# Patient Record
Sex: Female | Born: 1990 | Race: White | Hispanic: No | Marital: Single | Smoking: Current every day smoker
Health system: Southern US, Community
[De-identification: ages and names within clinical notes are randomized; demographics above are authoritative.]

## PROBLEM LIST (undated history)

## (undated) DIAGNOSIS — F419 Anxiety disorder, unspecified: Secondary | ICD-10-CM

## (undated) DIAGNOSIS — J45909 Unspecified asthma, uncomplicated: Secondary | ICD-10-CM

## (undated) DIAGNOSIS — I7300 Raynaud's syndrome without gangrene: Secondary | ICD-10-CM

## (undated) HISTORY — PX: WISDOM TOOTH EXTRACTION: SHX21

---

## 2017-12-03 ENCOUNTER — Telehealth: Payer: Self-pay | Admitting: *Deleted

## 2017-12-04 ENCOUNTER — Other Ambulatory Visit: Payer: Self-pay | Admitting: *Deleted

## 2017-12-04 DIAGNOSIS — O3680X0 Pregnancy with inconclusive fetal viability, not applicable or unspecified: Secondary | ICD-10-CM

## 2017-12-04 NOTE — Progress Notes (Signed)
Pt scheduled @ [facility] for dating [facility]/S on 9/13 @ 1:30.

## 2017-12-21 ENCOUNTER — Ambulatory Visit (HOSPITAL_COMMUNITY): Admission: RE | Admit: 2017-12-21 | Payer: Medicaid Other | Source: Ambulatory Visit

## 2018-06-10 NOTE — Telephone Encounter (Signed)
Note sent to nurse. 

## 2018-11-05 ENCOUNTER — Encounter: Payer: Self-pay | Admitting: Physical Therapy

## 2018-11-05 ENCOUNTER — Ambulatory Visit: Payer: Medicaid Other | Attending: Physical Medicine and Rehabilitation | Admitting: Physical Therapy

## 2018-11-05 ENCOUNTER — Other Ambulatory Visit: Payer: Self-pay

## 2018-11-05 DIAGNOSIS — M545 Low back pain, unspecified: Secondary | ICD-10-CM

## 2018-11-05 DIAGNOSIS — M62830 Muscle spasm of back: Secondary | ICD-10-CM | POA: Diagnosis present

## 2018-11-05 DIAGNOSIS — R293 Abnormal posture: Secondary | ICD-10-CM | POA: Insufficient documentation

## 2018-11-05 NOTE — Therapy (Signed)
[facility] [address] [place], [place], [address] Phone: [phone]   Fax:  [phone]  Physical Therapy Evaluation  Patient Details  Name: Ashley Huffman MRN: [id number] Date of Birth: 08/26/1990 Referring Provider (PT): Almyra Free, MD    Encounter Date: 11/05/2018  PT End of Session - 11/05/18 1435    Visit Number  1    Date for PT Re-Evaluation  01/03/19    Authorization Type  Medicaid    Authorization Time Period  11/05/18 to 01/03/19, pending medicaid approval    PT Start Time  1408    PT Stop Time  1438    PT Time Calculation (min)  30 min    Activity Tolerance  Patient tolerated treatment well;No increased pain    Behavior During Therapy  [facility] for tasks assessed/performed       History reviewed. No pertinent past medical history.  History reviewed. No pertinent surgical history.  There were no vitals filed for this visit.   Subjective Assessment - 11/05/18 1410    Subjective  Pt states that she started having low back pain approximately one month after having her son 3 months ago. She has trouble caring for her son, standing and lifting. She has been going and doing swimming therapy on her own 2x/week which she thinks it helping.    Pertinent History  epidural, vaginal delivery without complications    Limitations  Lifting;Standing;House hold activities    Diagnostic tests  Xray: shows scoliosis and inflammation    Patient Stated Goals  decrease pain    Currently in Pain?  Yes    Pain Score  7     Pain Location  Back    Pain Orientation  Lower;Right;Left    Pain Descriptors / Indicators  Aching;Tightness    Pain Type  Acute pain    Pain Radiating Towards  sometimes shoots up her back    Pain Onset  More than a month ago    Pain Frequency  Intermittent    Aggravating Factors   any type activity    Pain Relieving Factors  laying on back, swimming    Effect of Pain on Daily Activities  limited  ability to care for her 47 month old during the day         [facility] PT Assessment - 11/05/18 0001      Assessment   Medical Diagnosis  low back pain    Referring Provider (PT)  Almyra Free, MD     Onset Date/Surgical Date  09/03/18    Next MD Visit  2 days     Prior Therapy  1 chiropractor visit which made things worse       Precautions   Precautions  None      Restrictions   Weight Bearing Restrictions  No      Balance Screen   Has the patient fallen in the past 6 months  No    Has the patient had a decrease in activity level because of a fear of falling?   No    Is the patient reluctant to leave their home because of a fear of falling?   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       Sensation   Additional Comments  denied numbness/tingling  Posture/Postural Control   Posture Comments  decreased abdominal activation noted in standing with increased lumbar lordosis      ROM / Strength   AROM / PROM / Strength  AROM;Strength   passive ROM: Lt hip IR/ER 25 deg, Rt hip IR/ER 30 deg      AROM   AROM Assessment Site  Lumbar    Lumbar Flexion  50% limited, decreased lumbar flexion noted    Lumbar Extension  75% limited, pain end range     Lumbar - Right Rotation  25% limited, pain     Lumbar - Left Rotation  25% limited, pain free      Strength   Strength Assessment Site  Hip;Knee    Right/Left Hip  Right;Left    Right Hip Flexion  5/5    Right Hip Extension  4/5    Right Hip External Rotation   4/5    Right Hip Internal Rotation  4/5    Right Hip ABduction  5/5    Left Hip Flexion  5/5    Left Hip Extension  4/5    Left Hip External Rotation  4/5    Left Hip Internal Rotation  4/5    Left Hip ABduction  5/5    Right/Left Knee  --   5/5 MMT      Flexibility   Soft Tissue Assessment /Muscle Length  yes    Quadriceps  --   (+) thomas test B hip  flexors                Objective measurements completed on examination: See above findings.      [facility] Adult PT Treatment/Exercise - 11/05/18 0001      Self-Care   Self-Care  Posture    Posture  proper breathing in supine; lumbar towel roll while seated              PT Education - 11/05/18 1459    Education Details  eval findings/POC    Person(s) Educated  Patient    Methods  Explanation    Comprehension  Verbalized understanding       PT Short Term Goals - 11/05/18 1448      PT SHORT TERM GOAL #1   Title  Pt will demo consistency and independence with her initial HEP to improve pain and mobility.    Time  3    Period  Weeks    Status  New    Target Date  11/26/18      PT SHORT TERM GOAL #2   Title  Pt will report atleast 30% improvement in her low back pain from the start of PT.    Time  3    Period  Weeks    Status  New      PT SHORT TERM GOAL #3   Title  Pt will demo safe floor to stand transition while holding her son, 2/3 trials, without the need for therapist cuing.    Time  3    Period  Weeks    Status  New        PT Long Term Goals - 11/05/18 1450      PT LONG TERM GOAL #1   Title  Pt will have greater than 40 deg Lt and Rt hip internal and external rotation which will help with mechanics during squat and other daily activity.    Time  8    Period  Weeks    Status  New  Target Date  01/03/19      PT LONG TERM GOAL #2   Title  Pt will report atleast 60% improvement in her low back pain throughout the day to improve her quality of life and her ability to care for her son.    Time  8    Period  Weeks    Status  New      PT LONG TERM GOAL #3   Title  Pt will be able to complete active lumbar ROM without increase in low back pain which will help with her quality of movement during the day.    Time  8    Period  Weeks    Status  New      PT LONG TERM GOAL #4   Title  Pt will be able to lift atleast #10 box from the floor 2/3  trials without increase in low back pain or the need for therapist cuing for proper technique.    Time  8    Period  Weeks    Status  New      PT LONG TERM GOAL #5   Title  Pt will report being able to sleep atleast 4 days a week without interruption from back pain.    Time  8    Period  Weeks    Status  New             Plan - 11/05/18 1439    Clinical Impression Statement  Pt is a pleasant 28 y.o F referred to OPPT with complaints of low back pain following the birth of her son. Her pain began approximately 1 month after delivery and has been unchanged with medication since then. She demonstrates trunk weakness evident by standing posture. In addition, she has near full LE strength but greatly limited hip extension and rotation bilaterally. There are also lumbar flexibility limitations along with muscle spasm of the lumbar musculature which causes pain with bending and twisting. This is greatly limiting her ability to care for herself and her 233 month old son throughout the day. She would benefit from skilled PT intervention to educate on safe body mechanics and decrease pain and promote strength/flexibility improvements in order to increase her trunk support and mobility throughout the day.    Personal Factors and Comorbidities  Fitness    Examination-Activity Limitations  Lift;Stand;Caring for Others    Examination-Participation Restrictions  Yard Work;Cleaning;Laundry;Other    Stability/Clinical Decision Making  Stable/Uncomplicated    Clinical Decision Making  Low    Rehab Potential  Good    PT Frequency  2x / week    PT Duration  8 weeks    PT Treatment/Interventions  Moist Heat;ADLs/Self Care Home Management;Cryotherapy;Neuromuscular re-education;Therapeutic exercise;Therapeutic activities;Patient/family education;Manual techniques;Passive range of motion;Dry needling;Taping;Spinal Manipulations    PT Next Visit Plan  f/u on proper breathing; hip flexor/rotator stretch; thoracic  and lumbar mobility; begin supine abdominal strengthening    PT Home Exercise Plan  next visit; working on breathing    Consulted and Agree with Plan of Care  Patient       Patient will benefit from skilled therapeutic intervention in order to improve the following deficits and impairments:  Decreased activity tolerance, Decreased strength, Impaired flexibility, Postural dysfunction, Pain, Improper body mechanics, Decreased range of motion, Hypomobility, Increased muscle spasms  Visit Diagnosis: 1. Acute midline low back pain without sciatica   2. Muscle spasm of back   3. Abnormal posture  Problem List There are no active problems to display for this patient.   3:00 PM,11/05/18 Donita BrooksSara Kynzlie Hilleary PT, DPT [facility]  [phone]  [facility] [address] [place], [place], [id number] Phone: [phone]   Fax:  [phone]  Name: Annette Stableatalie Dashner MRN: [id number] Date of Birth: 03/01/1991

## 2018-11-12 ENCOUNTER — Encounter: Payer: Medicaid Other | Admitting: Physical Therapy

## 2018-11-19 ENCOUNTER — Ambulatory Visit: Payer: Medicaid Other | Attending: Physical Medicine and Rehabilitation | Admitting: Physical Therapy

## 2018-11-19 ENCOUNTER — Telehealth: Payer: Self-pay | Admitting: Physical Therapy

## 2018-11-19 DIAGNOSIS — M62830 Muscle spasm of back: Secondary | ICD-10-CM | POA: Insufficient documentation

## 2018-11-19 DIAGNOSIS — M545 Low back pain: Secondary | ICD-10-CM | POA: Insufficient documentation

## 2018-11-19 DIAGNOSIS — R293 Abnormal posture: Secondary | ICD-10-CM | POA: Insufficient documentation

## 2018-11-19 NOTE — Telephone Encounter (Signed)
No show. Pt states she thought her appointment was at 430 pm. Therapist offered her a 3 pm appointment from cancellation, but she did not feel that she could make that. She confirmed her next appointment.   2:00 PM,11/19/18 [place], [place] at [place]

## 2018-11-26 ENCOUNTER — Ambulatory Visit: Payer: Medicaid Other | Admitting: Physical Therapy

## 2018-11-26 ENCOUNTER — Other Ambulatory Visit: Payer: Self-pay

## 2018-11-26 ENCOUNTER — Encounter: Payer: Self-pay | Admitting: Physical Therapy

## 2018-11-26 DIAGNOSIS — M62830 Muscle spasm of back: Secondary | ICD-10-CM | POA: Diagnosis present

## 2018-11-26 DIAGNOSIS — R293 Abnormal posture: Secondary | ICD-10-CM | POA: Diagnosis present

## 2018-11-26 DIAGNOSIS — M545 Low back pain, unspecified: Secondary | ICD-10-CM

## 2018-11-26 NOTE — Patient Instructions (Signed)
Access Code: [email or web address]  URL: [email or web address]  Date: 11/26/2018  Prepared by: Sherol Dade   Exercises  Supine Piriformis Stretch with Foot on Ground - 2 reps - 20 hold - 2x daily - 7x weekly  Supine Piriformis Stretch - 2 reps - 20 hold - 2x daily - 7x weekly  Supine Bridge - 10 reps - 2 sets - 1x daily - 7x weekly  Sidelying Thoracic and Shoulder Rotation - 10 reps - 2x daily - 7x weekly    [facility] [address], [place], [place] [address] Phone # [phone] Fax [phone]

## 2018-11-26 NOTE — Therapy (Addendum)
[facility] [address] [place], [place], [address] Phone: [phone]   Fax:  [phone]  Physical Therapy Treatment/Discharge  Patient Details  Name: Ashley Huffman MRN: [id number] Date of Birth: 09/07/1990 Referring Provider (PT): Almyra Free, MD    Encounter Date: 11/26/2018  PT End of Session - 11/26/18 1414    Visit Number  2    Date for PT Re-Evaluation  01/03/19    Authorization Type  Medicaid    Authorization Time Period  11/05/18 to 01/03/19, pending medicaid approval    PT Start Time  1335    PT Stop Time  1414    PT Time Calculation (min)  39 min    Activity Tolerance  Patient tolerated treatment well;No increased pain    Behavior During Therapy  [facility] for tasks assessed/performed       History reviewed. No pertinent past medical history.  History reviewed. No pertinent surgical history.  There were no vitals filed for this visit.  Subjective Assessment - 11/26/18 1336    Subjective  Pt states that she is back working 30 hours a week. She is still caring for her son when not at work. She feels her back is worse since she was here last becasue she is busier.    Pertinent History  epidural, vaginal delivery without complications    Limitations  Lifting;Standing;House hold activities    Diagnostic tests  Xray: shows scoliosis and inflammation    Patient Stated Goals  decrease pain    Currently in Pain?  --   no change in pain   Pain Onset  More than a month ago                       [facility] Adult PT Treatment/Exercise - 11/26/18 0001      Self-Care   Self-Care  Posture    Posture  demo floor to stand hands free       Exercises   Exercises  Lumbar      Lumbar Exercises: Stretches   Piriformis Stretch  3 reps;Left;Right;30 seconds    Piriformis Stretch Limitations  supine       Lumbar Exercises: Supine   Bridge  10 reps    Bridge Limitations  breathing coordination; x5 reps  with yellow TB row each side       Lumbar Exercises: Sidelying   Other Sidelying Lumbar Exercises  thoracic rotation strech x10 reps each direction      Manual Therapy   Manual Therapy  Soft tissue mobilization;Joint mobilization    Joint Mobilization  CPAs lumbar spine, Grade III-IV x1 bout     Soft tissue mobilization  STM Rt lumbar paraspinals, Rt QL             PT Education - 11/26/18 1413    Education Details  implemented HEP; hands free floor to stand    Person(s) Educated  Patient    Methods  Explanation;Demonstration;Handout    Comprehension  Verbalized understanding;Returned demonstration       PT Short Term Goals - 11/26/18 1511      PT SHORT TERM GOAL #1   Title  Pt will demo consistency and independence with her initial HEP to improve pain and mobility.    Baseline  implemented    Time  3    Period  Weeks    Status  Partially Met    Target Date  11/26/18      PT  SHORT TERM GOAL #2   Title  Pt will report atleast 30% improvement in her low back pain from the start of PT.    Time  3    Period  Weeks    Status  New      PT SHORT TERM GOAL #3   Title  Pt will demo safe floor to stand transition while holding her son, 2/3 trials, without the need for therapist cuing.    Baseline  1/3 trials during today's session    Time  3    Period  Weeks    Status  Partially Met        PT Long Term Goals - 11/05/18 1450      PT LONG TERM GOAL #1   Title  Pt will have greater than 40 deg Lt and Rt hip internal and external rotation which will help with mechanics during squat and other daily activity.    Time  8    Period  Weeks    Status  New    Target Date  01/03/19      PT LONG TERM GOAL #2   Title  Pt will report atleast 60% improvement in her low back pain throughout the day to improve her quality of life and her ability to care for her son.    Time  8    Period  Weeks    Status  New      PT LONG TERM GOAL #3   Title  Pt will be able to complete active  lumbar ROM without increase in low back pain which will help with her quality of movement during the day.    Time  8    Period  Weeks    Status  New      PT LONG TERM GOAL #4   Title  Pt will be able to lift atleast #10 box from the floor 2/3 trials without increase in low back pain or the need for therapist cuing for proper technique.    Time  8    Period  Weeks    Status  New      PT LONG TERM GOAL #5   Title  Pt will report being able to sleep atleast 4 days a week without interruption from back pain.    Time  8    Period  Weeks    Status  New            Plan - 11/26/18 1416    Clinical Impression Statement  Pt was unable to attend her previous 2 appointments due to scheduling conflicts secondary to [id number] restrictions at the clinic. Today's session focused on implementing her HEP to further increase hip flexibility and spine mobility in the thoracic and lumbar region. She has been able to demonstrate progress towards her short term goals, demonstrating good understanding of HEP updates as well as safe floor to stand transition without UE support 1 out of 3 times. Pt continues to have pain throughout the day and while caring for her son although she is now more active without any increase in pain since the evaluation. She has palpable muscle spasm and tenderness of the lumbar paraspinals which was addressed some this visit with manual techniques. Pt also continues to demonstrate limitations in hip flexibility and strength as expected with the limited number of sessions she has been able to attend at this point. She would continue to benefit from skilled PT to increase hip strength, trunk strength  and LE/trunk flexibility in order to improve her independence and ability to care for her son throughout the day.    Personal Factors and Comorbidities  Fitness    Examination-Activity Limitations  Lift;Stand;Caring for Others    Examination-Participation Restrictions  Yard  Work;Cleaning;Laundry;Other    Stability/Clinical Decision Making  Stable/Uncomplicated    Rehab Potential  Good    PT Frequency  2x / week    PT Duration  8 weeks    PT Treatment/Interventions  Moist Heat;ADLs/Self Care Home Management;Cryotherapy;Neuromuscular re-education;Therapeutic exercise;Therapeutic activities;Patient/family education;Manual techniques;Passive range of motion;Dry needling;Taping;Spinal Manipulations    PT Next Visit Plan  f/u on HEP; thoracic and lumbar mobility; progress abdominal and hip strength    PT Home Exercise Plan  [email or web address]    Consulted and Agree with Plan of Care  Patient       Patient will benefit from skilled therapeutic intervention in order to improve the following deficits and impairments:  Decreased activity tolerance, Decreased strength, Impaired flexibility, Postural dysfunction, Pain, Improper body mechanics, Decreased range of motion, Hypomobility, Increased muscle spasms  Visit Diagnosis: 1. Acute midline low back pain without sciatica   2. Muscle spasm of back   3. Abnormal posture        Problem List There are no active problems to display for this patient.   4:59 PM,11/26/18 Sherol Dade PT, [place] at [place]  [facility] [address] [place], [place], [address] Phone: [phone]   Fax:  [phone]  Name: Tyrina Hines MRN: [id number] Date of Birth: Oct 19, 1990  *addendum to resolve episode of care and d/c pt from PT  [place]  Visits from Start of Care: 2  Current functional level related to goals / functional outcomes: See above for more details    Remaining deficits: See above for more details    Education / Equipment: See above for more details   Plan: Patient agrees to discharge.  Patient goals were not met. Patient is being discharged due to not returning since the  last visit.  ?????     2:26 PM,02/25/19 [place], [place] at [place]

## 2018-12-03 ENCOUNTER — Telehealth: Payer: Self-pay | Admitting: Physical Therapy

## 2018-12-03 ENCOUNTER — Ambulatory Visit: Payer: Medicaid Other | Admitting: Physical Therapy

## 2018-12-03 NOTE — Telephone Encounter (Signed)
No show. Unable to leave voicemail for pt because mailbox is full.   1:51 PM,12/03/18 Sherol Dade PT, [place] at [place]

## 2018-12-10 ENCOUNTER — Telehealth: Payer: Self-pay | Admitting: Physical Therapy

## 2018-12-10 ENCOUNTER — Ambulatory Visit: Payer: Medicaid Other | Attending: Physical Medicine and Rehabilitation | Admitting: Physical Therapy

## 2018-12-10 NOTE — Telephone Encounter (Signed)
No show. PT unable to leave voicemail due to mailbox being full. Due to poor attendance, pt will be discharged.  2:30 PM,12/10/18 [place], [place] at [place]

## 2018-12-17 ENCOUNTER — Ambulatory Visit: Payer: Medicaid Other | Admitting: Physical Therapy

## 2019-01-18 ENCOUNTER — Other Ambulatory Visit: Payer: Self-pay

## 2019-01-18 ENCOUNTER — Emergency Department (HOSPITAL_COMMUNITY)
Admission: EM | Admit: 2019-01-18 | Discharge: 2019-01-19 | Disposition: A | Discharge status: Home / Self Care | Payer: Medicaid Other | Attending: Emergency Medicine | Admitting: Emergency Medicine

## 2019-01-18 ENCOUNTER — Encounter (HOSPITAL_COMMUNITY): Payer: Self-pay | Admitting: *Deleted

## 2019-01-18 DIAGNOSIS — F17210 Nicotine dependence, cigarettes, uncomplicated: Secondary | ICD-10-CM | POA: Insufficient documentation

## 2019-01-18 DIAGNOSIS — B349 Viral infection, unspecified: Secondary | ICD-10-CM | POA: Diagnosis not present

## 2019-01-18 DIAGNOSIS — J45909 Unspecified asthma, uncomplicated: Secondary | ICD-10-CM | POA: Diagnosis not present

## 2019-01-18 DIAGNOSIS — R509 Fever, unspecified: Secondary | ICD-10-CM | POA: Diagnosis present

## 2019-01-18 HISTORY — DX: Raynaud's syndrome without gangrene: I73.00

## 2019-01-18 HISTORY — DX: Unspecified asthma, uncomplicated: J45.909

## 2019-01-18 HISTORY — DX: Anxiety disorder, unspecified: F41.9

## 2019-01-18 NOTE — ED Triage Notes (Signed)
Pt c/o low grade fever, cold blister on bottom, fatigue, chills, nausea, sore throat that started yesterday,

## 2019-01-18 NOTE — ED Provider Notes (Signed)
[facility] EMERGENCY DEPARTMENT Provider Note   CSN: [id number] Arrival date & time: 01/18/19  2053     History   Chief Complaint Chief Complaint  Patient presents with  . Fever    HPI Ashley Huffman is a 28 y.o. female with a history of asthma and anxiety presenting with a 2 day history of fever to 99.9, generalized fatigue and body aches, intermittent fevers and chills nausea without emesis, sore throat and generalized weakness.  She denies shortness of breath or wheezing, no cough, chest pain or abdominal pain.  She has had some intermittent mild nausea without emesis.  She has developed a cold sore on her lower lip which she states occurs when she is ill.  She denies diarrhea, dysuria, rash, neck pain or stiffness.  She has had a generalized headache.  She has taken Tylenol based sinus medication without significant improvement in symptoms.  She has had no known COVID-19 exposures but is concerned about this possible infection.     The history is provided by the patient.    Past Medical History:  Diagnosis Date  . Anxiety   . Asthma   . Raynaud disease     There are no active problems to display for this patient.   Past Surgical History:  Procedure Laterality Date  . WISDOM TOOTH EXTRACTION       OB History   No obstetric history on file.      Home Medications    Prior to Admission medications   Medication Sig Start Date End Date Taking? Authorizing Provider  ferrous sulfate 325 (65 FE) MG tablet Take 325 mg by mouth daily with breakfast.    Yes [provider]    Family History No family history on file.  Social History Social History   Tobacco Use  . Smoking status: Current Every Day Smoker  . Smokeless tobacco: Never Used  Substance Use Topics  . Alcohol use: Never    Frequency: Never  . Drug use: Never     Allergies   Keflex [cephalexin]   Review of Systems Review of Systems  Constitutional: Positive for chills and fever.   HENT: Positive for sore throat. Negative for congestion, ear pain, rhinorrhea, sinus pressure, trouble swallowing and voice change.   Eyes: Negative for discharge.  Respiratory: Positive for cough. Negative for shortness of breath, wheezing and stridor.   Cardiovascular: Negative for chest pain.  Gastrointestinal: Positive for nausea. Negative for abdominal pain and vomiting.  Genitourinary: Negative.  Negative for dysuria.  Musculoskeletal: Positive for myalgias. Negative for neck pain and neck stiffness.  Skin: Negative.   Neurological: Positive for headaches.     Physical Exam Updated Vital Signs BP (!) 132/92   Pulse 60   Temp 98.2 F (36.8 C)   Resp 18   Ht 5\' 5"  (1.651 m)   Wt 59 kg   SpO2 99%   BMI 21.63 kg/m   Physical Exam Vitals signs and nursing note reviewed.  Constitutional:      Appearance: She is well-developed.  HENT:     Head: Normocephalic and atraumatic.     Nose: Mucosal edema present. No congestion or rhinorrhea.     Mouth/Throat:     Mouth: Mucous membranes are moist.     Pharynx: Uvula midline. No oropharyngeal exudate or posterior oropharyngeal erythema.     Tonsils: No tonsillar abscesses.     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  Eyes:     Conjunctiva/sclera: Conjunctivae  normal.  Neck:     Musculoskeletal: Normal range of motion. No neck rigidity.  Cardiovascular:     Rate and Rhythm: Normal rate.     Heart sounds: Normal heart sounds.  Pulmonary:     Effort: Pulmonary effort is normal. No respiratory distress.     Breath sounds: Normal breath sounds. No wheezing, rhonchi or rales.  Abdominal:     General: Abdomen is flat.     Palpations: Abdomen is soft.     Tenderness: There is no abdominal tenderness.  Musculoskeletal: Normal range of motion.  Skin:    General: Skin is warm and dry.     Findings: No rash.  Neurological:     General: No focal deficit present.     Mental Status: She is alert and oriented to  person, place, and time.      ED Treatments / Results  Labs (all labs ordered are listed, but only abnormal results are displayed) Labs Reviewed  NOVEL CORONAVIRUS, NAA (HOSP ORDER, SEND-OUT TO REF LAB; TAT 18-24 HRS)    EKG None  Radiology No results found.  Procedures Procedures (including critical care time)  Medications Ordered in ED Medications - No data to display   Initial Impression / Assessment and Plan / ED Course  I have reviewed the triage vital signs and the nursing notes.  Pertinent labs & imaging results that were available during my care of the patient were reviewed by me and considered in my medical decision making (see chart for details).        Patient with symptoms suggesting a viral syndrome, she has been screened for COVID-19 tonight.  She was encouraged to rest, may try ibuprofen for better relief of myalgias.  Discussed increase fluid intake.  Also outlined strict return precautions.  She was given information about home isolation pending results of her COVID-19 test.  Her exam is reassuring at this time with no shortness of breath, lungs are clear to auscultation, vital signs stable.  Discussed OTC Abreva for cold sore treatment.  [name] was evaluated in Emergency Department on 01/18/2019 for the symptoms described in the history of present illness. She was evaluated in the context of the global COVID-19 pandemic, which necessitated consideration that the patient might be at risk for infection with the SARS-CoV-2 virus that causes COVID-19. Institutional protocols and algorithms that pertain to the evaluation of patients at risk for COVID-19 are in a state of rapid change based on information released by regulatory bodies including the CDC and federal and state organizations. These policies and algorithms were followed during the patient's care in the ED.   Final Clinical Impressions(s) / ED Diagnoses   Final diagnoses:  Viral syndrome     ED Discharge Orders    None       Landis Martins 01/18/19 2346    Rolland Porter, MD 01/18/19 2348

## 2019-01-18 NOTE — Discharge Instructions (Addendum)
Rest,  Drink plenty of fluids.  Take motrin or tylenol for achiness and fever reduction.  Your Covid test is pending and should result within 1-2 days.  It will be important for you to maintain home isolation until your test is resulted (and negative).  If positive you will need to maintain home isolation for 10 days or until your symptoms have resolved.  Get rechecked for increased shortness of breath,  Increased fever or increasing weakness.     Person Under Monitoring Name: Ashley Huffman  Location[place] [address] [place] [id number]   Infection Prevention Recommendations for Individuals Confirmed to have, or Being Evaluated for, 2019 Novel Coronavirus (COVID-19) Infection Who Receive Care at Home  Individuals who are confirmed to have, or are being evaluated for, COVID-19 should follow the prevention steps below until a healthcare provider or local or state health department says they can return to normal activities.  Stay home except to get medical care You should restrict activities outside your home, except for getting medical care. Do not go to work, school, or public areas, and do not use public transportation or taxis.  Call ahead before visiting your doctor Before your medical appointment, call the healthcare provider and tell them that you have, or are being evaluated for, COVID-19 infection. This will help the healthcare providers office take steps to keep other people from getting infected. Ask your healthcare provider to call the local or state health department.  Monitor your symptoms Seek prompt medical attention if your illness is worsening (e.g., difficulty breathing). Before going to your medical appointment, call the healthcare provider and tell them that you have, or are being evaluated for, COVID-19 infection. Ask your healthcare provider to call the local or state health department.  Wear a facemask You should wear a facemask that covers your nose and mouth  when you are in the same room with other people and when you visit a healthcare provider. People who live with or visit you should also wear a facemask while they are in the same room with you.  Separate yourself from other people in your home As much as possible, you should stay in a different room from other people in your home. Also, you should use a separate bathroom, if available.  Avoid sharing household items You should not share dishes, drinking glasses, cups, eating utensils, towels, bedding, or other items with other people in your home. After using these items, you should wash them thoroughly with soap and water.  Cover your coughs and sneezes Cover your mouth and nose with a tissue when you cough or sneeze, or you can cough or sneeze into your sleeve. Throw used tissues in a lined trash can, and immediately wash your hands with soap and water for at least 20 seconds or use an alcohol-based hand rub.  Wash your [employer] Corporationhands Wash your hands often and thoroughly with soap and water for at least 20 seconds. You can use an alcohol-based hand sanitizer if soap and water are not available and if your hands are not visibly dirty. Avoid touching your eyes, nose, and mouth with unwashed hands.   Prevention Steps for Caregivers and Household Members of Individuals Confirmed to have, or Being Evaluated for, COVID-19 Infection Being Cared for in the Home  If you live with, or provide care at home for, a person confirmed to have, or being evaluated for, COVID-19 infection please follow these guidelines to prevent infection:  Follow healthcare providers instructions Make sure that  you understand and can help the patient follow any healthcare provider instructions for all care.  Provide for the patients basic needs You should help the patient with basic needs in the home and provide support for getting groceries, prescriptions, and other personal needs.  Monitor the patients symptoms If  they are getting sicker, call his or her medical provider and tell them that the patient has, or is being evaluated for, COVID-19 infection. This will help the healthcare providers office take steps to keep other people from getting infected. Ask the healthcare provider to call the local or state health department.  Limit the number of people who have contact with the patient If possible, have only one caregiver for the patient. Other household members should stay in another home or place of residence. If this is not possible, they should stay in another room, or be separated from the patient as much as possible. Use a separate bathroom, if available. Restrict visitors who do not have an essential need to be in the home.  Keep older adults, very young children, and other sick people away from the patient Keep older adults, very young children, and those who have compromised immune systems or chronic health conditions away from the patient. This includes people with chronic heart, lung, or kidney conditions, diabetes, and cancer.  Ensure good ventilation Make sure that shared spaces in the home have good air flow, such as from an air conditioner or an opened window, weather permitting.  Wash your hands often Wash your hands often and thoroughly with soap and water for at least 20 seconds. You can use an alcohol based hand sanitizer if soap and water are not available and if your hands are not visibly dirty. Avoid touching your eyes, nose, and mouth with unwashed hands. Use disposable paper towels to dry your hands. If not available, use dedicated cloth towels and replace them when they become wet.  Wear a facemask and gloves Wear a disposable facemask at all times in the room and gloves when you touch or have contact with the patients blood, body fluids, and/or secretions or excretions, such as sweat, saliva, sputum, nasal mucus, vomit, urine, or feces.  Ensure the mask fits over your nose  and mouth tightly, and do not touch it during use. Throw out disposable facemasks and gloves after using them. Do not reuse. Wash your hands immediately after removing your facemask and gloves. If your personal clothing becomes contaminated, carefully remove clothing and launder. Wash your hands after handling contaminated clothing. Place all used disposable facemasks, gloves, and other waste in a lined container before disposing them with other household waste. Remove gloves and wash your hands immediately after handling these items.  Do not share dishes, glasses, or other household items with the patient Avoid sharing household items. You should not share dishes, drinking glasses, cups, eating utensils, towels, bedding, or other items with a patient who is confirmed to have, or being evaluated for, COVID-19 infection. After the person uses these items, you should wash them thoroughly with soap and water.  Wash laundry thoroughly Immediately remove and wash clothes or bedding that have blood, body fluids, and/or secretions or excretions, such as sweat, saliva, sputum, nasal mucus, vomit, urine, or feces, on them. Wear gloves when handling laundry from the patient. Read and follow directions on labels of laundry or clothing items and detergent. In general, wash and dry with the warmest temperatures recommended on the label.  Clean all areas the individual has used  often Clean all touchable surfaces, such as counters, tabletops, doorknobs, bathroom fixtures, toilets, phones, keyboards, tablets, and bedside tables, every day. Also, clean any surfaces that may have blood, body fluids, and/or secretions or excretions on them. Wear gloves when cleaning surfaces the patient has come in contact with. Use a diluted bleach solution (e.g., dilute bleach with 1 part bleach and 10 parts water) or a household disinfectant with a label that says EPA-registered for coronaviruses. To make a bleach solution at  home, add 1 tablespoon of bleach to 1 quart (4 cups) of water. For a larger supply, add  cup of bleach to 1 gallon (16 cups) of water. Read labels of cleaning products and follow recommendations provided on product labels. Labels contain instructions for safe and effective use of the cleaning product including precautions you should take when applying the product, such as wearing gloves or eye protection and making sure you have good ventilation during use of the product. Remove gloves and wash hands immediately after cleaning.  Monitor yourself for signs and symptoms of illness Caregivers and household members are considered close contacts, should monitor their health, and will be asked to limit movement outside of the home to the extent possible. Follow the monitoring steps for close contacts listed on the symptom monitoring form.   ? If you have additional questions, contact your local health department or call the epidemiologist on call at [phone] (available 24/7). ? This guidance is subject to change. For the most up-to-date guidance from [facility], please refer to their website: [email or web address]

## 2019-01-19 NOTE — ED Notes (Signed)
Pt ambulatory to waiting room. Pt verbalized understanding of discharge instructions.   

## 2019-01-19 NOTE — ED Notes (Signed)
Pt left prior to getting COVID swab. Upon entering pts room pt was not in acute care room.

## 2019-11-02 ENCOUNTER — Other Ambulatory Visit: Payer: Self-pay

## 2019-11-02 ENCOUNTER — Emergency Department (HOSPITAL_COMMUNITY): Payer: Medicaid Other

## 2019-11-02 ENCOUNTER — Emergency Department (HOSPITAL_COMMUNITY)
Admission: EM | Admit: 2019-11-02 | Discharge: 2019-11-02 | Disposition: A | Discharge status: Home / Self Care | Payer: Medicaid Other | Attending: Emergency Medicine | Admitting: Emergency Medicine

## 2019-11-02 ENCOUNTER — Encounter (HOSPITAL_COMMUNITY): Payer: Self-pay

## 2019-11-02 DIAGNOSIS — F17200 Nicotine dependence, unspecified, uncomplicated: Secondary | ICD-10-CM | POA: Diagnosis not present

## 2019-11-02 DIAGNOSIS — M79605 Pain in left leg: Secondary | ICD-10-CM | POA: Insufficient documentation

## 2019-11-02 DIAGNOSIS — Y999 Unspecified external cause status: Secondary | ICD-10-CM | POA: Diagnosis not present

## 2019-11-02 DIAGNOSIS — Y939 Activity, unspecified: Secondary | ICD-10-CM | POA: Insufficient documentation

## 2019-11-02 DIAGNOSIS — W19XXXA Unspecified fall, initial encounter: Secondary | ICD-10-CM

## 2019-11-02 DIAGNOSIS — S8002XA Contusion of left knee, initial encounter: Secondary | ICD-10-CM | POA: Insufficient documentation

## 2019-11-02 DIAGNOSIS — Y929 Unspecified place or not applicable: Secondary | ICD-10-CM | POA: Diagnosis not present

## 2019-11-02 DIAGNOSIS — S80912A Unspecified superficial injury of left knee, initial encounter: Secondary | ICD-10-CM | POA: Diagnosis present
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
## 2019-11-02 NOTE — Discharge Instructions (Signed)
Take Tylenol or Motrin for pain and follow-up in 1 to 2 weeks for recheck
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
## 2019-11-02 NOTE — ED Notes (Signed)
Pt seen leaving department with her significant other; did not inform staff she was leaving; no discharge papers, crutches or ace wrap given
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
## 2022-02-13 ENCOUNTER — Encounter: Payer: Self-pay | Admitting: Internal Medicine

## 2022-02-13 ENCOUNTER — Ambulatory Visit: Payer: Medicaid Other | Admitting: Internal Medicine

## 2022-02-13 VITALS — BP 116/83 | HR 81 | Ht 65.0 in | Wt 116.8 lb

## 2022-02-13 DIAGNOSIS — Z0001 Encounter for general adult medical examination with abnormal findings: Secondary | ICD-10-CM

## 2022-02-13 DIAGNOSIS — F419 Anxiety disorder, unspecified: Secondary | ICD-10-CM | POA: Diagnosis not present

## 2022-02-13 DIAGNOSIS — F32A Depression, unspecified: Secondary | ICD-10-CM

## 2022-02-13 NOTE — Assessment & Plan Note (Signed)
We chiefly discussed Ashley Huffman's anxiety and depression today.  Her PHQ-9 and GAD-7 scores are elevated today.  This is her main concern and is ultimately the reason she decided to establish care in our practice today.  She is currently prescribed Klonopin 0.5 mg twice daily, Lexapro 20 mg daily, and trazodone 50 mg bedtime as needed.  She recently told her previous providers that she needed to take Klonopin 3 times daily.  Her medications were adjusted, but Klonopin the frequency of Klonopin was not increased.  According to Ashley Huffman, additional comments were made that ultimately led her to leave the practice.  She became quite tearful when discussing her recent circumstances.  She denies SI/HI. -She has an appointment with psychiatry tomorrow (11/7).  Given her psychiatric history and that she has failed multiple medications previously, it is reasonable for her to establish care with psychiatry.  She states that she may ultimately request a referral to psychiatry in [place].  We will discuss this at follow-up in 2-4 weeks.

## 2022-02-13 NOTE — Assessment & Plan Note (Signed)
Presenting today to establish care.  Available medical records and labs reviewed.  We extensively discussed her history of anxiety and depression.  She will follow-up in 2-4 weeks, at which time we can further review her past medical history and address outstanding preventative care items.

## 2022-02-13 NOTE — Patient Instructions (Signed)
It was a pleasure to see you today.  Thank you for giving [place] the opportunity to be involved in your care.  Below is a brief recap of your visit and next steps.  We will plan to see you again in 2-4 weeks.  Summary You have established care today Please let me know how your psychiatry appointment tomorrow goes. We will follow up in 2-4 weeks.

## 2022-02-13 NOTE — Progress Notes (Signed)
New Patient Office Visit  Subjective    Patient ID: Ashley Huffman, female    DOB: 1990-05-01  Age: 31 y.o. MRN: [id number]  CC:  Chief Complaint  Patient presents with   New Patient (Initial Visit)    np   HPI Ashley Huffman presents to establish care.  She is a 31 year old woman with a past medical history significant for anxiety/depression, current tobacco abuse, HCV (treated 2023), and iron deficiency.  She was previously followed by [facility].  Her chief concern today is discussing her mental health.  Ms. Nored states that has decided to switch providers because she did not feel listened to previously.  She became tearful when discussing her current anxiety and depression.  She is currently prescribed Lexapro, Klonopin, and trazodone.  She was also previously prescribed BuSpar.  Ms. Schuff endorses an extensive history of anxiety and depression.  She states that she has been prescribed Seroquel in the past as well.  She states that within the last year there were several months where she did not leave her home due to anxiety and caring for her father-in-law.  Her symptoms have improved since that time, however she is concerned about running out of her medications today.  Of note, Ms. Skilton has previously been referred to psychiatry.  She has an appointment scheduled for tomorrow (11/7).  Outpatient Encounter Medications as of 02/13/2022  Medication Sig   clonazePAM (KLONOPIN) 0.5 MG tablet Take 0.5 mg by mouth 2 (two) times daily.   escitalopram (LEXAPRO) 20 MG tablet Take 20 mg by mouth daily.   ferrous sulfate 325 (65 FE) MG tablet Take 325 mg by mouth daily with breakfast.    traZODone (DESYREL) 50 MG tablet Take 50 mg by mouth at bedtime as needed.   No facility-administered encounter medications on file as of 02/13/2022.    Past Medical History:  Diagnosis Date   Anxiety    Asthma    Raynaud disease     Past Surgical History:  Procedure  Laterality Date   WISDOM TOOTH EXTRACTION      History reviewed. No pertinent family history.  Social History   Socioeconomic History   Marital status: Unknown    Spouse name: Not on file   Number of children: Not on file   Years of education: Not on file   Highest education level: Not on file  Occupational History   Not on file  Tobacco Use   Smoking status: Every Day    Packs/day: 1.00    Types: Cigarettes   Smokeless tobacco: Never  Substance and Sexual Activity   Alcohol use: Never   Drug use: Yes    Comment: cbd pens   Sexual activity: Not on file  Other Topics Concern   Not on file  Social History Narrative   Not on file   Social Determinants of Health   Financial Resource Strain: Not on file  Food Insecurity: Not on file  Transportation Needs: Not on file  Physical Activity: Not on file  Stress: Not on file  Social Connections: Not on file  Intimate Partner Violence: Not on file   Review of Systems  Constitutional:  Negative for chills and fever.  HENT:  Negative for sore throat.   Respiratory:  Negative for cough and shortness of breath.   Cardiovascular:  Negative for chest pain, palpitations and leg swelling.  Gastrointestinal:  Negative for abdominal pain, blood in stool, constipation, diarrhea, nausea and vomiting.  Genitourinary:  Negative for dysuria and hematuria.  Musculoskeletal:  Negative for myalgias.  Skin:  Negative for itching and rash.  Neurological:  Negative for dizziness and headaches.  Psychiatric/Behavioral:  Positive for depression. Negative for suicidal ideas. The patient is nervous/anxious.    Objective    BP 116/83 (BP Location: Right Arm, Patient Position: Sitting, Cuff Size: Normal)   Pulse 81   Ht 5\' 5"  (1.651 m)   Wt 116 lb 12.8 oz (53 kg)   LMP 01/23/2022 (Approximate)   SpO2 98%   BMI 19.44 kg/m   Physical Exam Constitutional:      General: She is not in acute distress.    Appearance: Normal appearance. She is  not toxic-appearing.     Comments: Tearful  HENT:     Head: Normocephalic and atraumatic.     Right Ear: External ear normal.     Left Ear: External ear normal.     Nose: Nose normal. No congestion or rhinorrhea.     Mouth/Throat:     Mouth: Mucous membranes are moist.     Pharynx: Oropharynx is clear. No oropharyngeal exudate or posterior oropharyngeal erythema.  Eyes:     General: No scleral icterus.    Extraocular Movements: Extraocular movements intact.     Conjunctiva/sclera: Conjunctivae normal.     Pupils: Pupils are equal, round, and reactive to light.  Cardiovascular:     Rate and Rhythm: Normal rate and regular rhythm.     Pulses: Normal pulses.     Heart sounds: Normal heart sounds. No murmur heard.    No friction rub. No gallop.  Pulmonary:     Effort: Pulmonary effort is normal.     Breath sounds: Normal breath sounds. No wheezing, rhonchi or rales.  Abdominal:     General: Abdomen is flat. Bowel sounds are normal. There is no distension.     Palpations: Abdomen is soft.     Tenderness: There is no abdominal tenderness.  Musculoskeletal:        General: No swelling. Normal range of motion.     Cervical back: Normal range of motion.     Right lower leg: No edema.     Left lower leg: No edema.  Lymphadenopathy:     Cervical: No cervical adenopathy.  Skin:    General: Skin is warm and dry.     Capillary Refill: Capillary refill takes less than 2 seconds.     Coloration: Skin is not jaundiced.  Neurological:     General: No focal deficit present.     Mental Status: She is alert and oriented to person, place, and time.  Psychiatric:        Behavior: Behavior normal.    Assessment & Plan:   Problem List Items Addressed This Visit       Anxiety and depression - Primary    We chiefly discussed Ms. Lohmann's anxiety and depression today.  Her PHQ-9 and GAD-7 scores are elevated today.  This is her main concern and is ultimately the reason she decided to establish  care in our practice today.  She is currently prescribed Klonopin 0.5 mg twice daily, Lexapro 20 mg daily, and trazodone 50 mg bedtime as needed.  She recently told her previous providers that she needed to take Klonopin 3 times daily.  Her medications were adjusted, but Klonopin the frequency of Klonopin was not increased.  According to Ms. Marrion Coy, additional comments were made that ultimately led her to leave the practice.  She became  quite tearful when discussing her recent circumstances.  She denies SI/HI. -She has an appointment with psychiatry tomorrow (11/7).  Given her psychiatric history and that she has failed multiple medications previously, it is reasonable for her to establish care with psychiatry.  She states that she may ultimately request a referral to psychiatry in [place].  We will discuss this at follow-up in 2-4 weeks.      Encounter for well adult exam with abnormal findings    Presenting today to establish care.  Available medical records and labs reviewed.  We extensively discussed her history of anxiety and depression.  She will follow-up in 2-4 weeks, at which time we can further review her past medical history and address outstanding preventative care items.       Return in about 2 weeks (around 02/27/2022).   Billie Lade, MD

## 2022-02-14 ENCOUNTER — Other Ambulatory Visit: Payer: Self-pay

## 2022-02-14 ENCOUNTER — Telehealth: Payer: Self-pay

## 2022-02-14 DIAGNOSIS — F419 Anxiety disorder, unspecified: Secondary | ICD-10-CM

## 2022-02-14 MED ORDER — ESCITALOPRAM OXALATE 10 MG PO TABS: 10.0000 mg | ORAL_TABLET | Freq: Every day | ORAL | 2 refills | Status: DC

## 2022-02-14 MED ORDER — ESCITALOPRAM OXALATE 20 MG PO TABS: 20.0000 mg | ORAL_TABLET | Freq: Every day | ORAL | 2 refills | Status: DC

## 2022-02-14 MED ORDER — CLONAZEPAM 0.5 MG PO TABS: 0.5000 mg | ORAL_TABLET | Freq: Two times a day (BID) | ORAL | 0 refills | Status: DC

## 2022-02-14 NOTE — Telephone Encounter (Signed)
Patient called said was suppose to be get referred to psychiatry in [place] at [address] . Patient said she is ready to setup the referral from seeing Dr Doren Custard 02/13/2022.   Also asking for some medicine to help until gets in with psychiatry doctor.

## 2022-02-14 NOTE — Telephone Encounter (Signed)
Rx for clonazepam 0.5 mg BID x 30 days ordered, not to be filled until she completes her current Rx. I have also prescribed Lexapro 10 mg daily. She currently has 20 mg tablets that she has been cutting in half at the instruction of her previous provider. She will return to care on 11/20.

## 2022-02-15 ENCOUNTER — Other Ambulatory Visit: Payer: Self-pay | Admitting: Internal Medicine

## 2022-02-15 DIAGNOSIS — F32A Depression, unspecified: Secondary | ICD-10-CM

## 2022-02-16 MED ORDER — CLONAZEPAM 0.5 MG PO TABS: 0.5000 mg | ORAL_TABLET | Freq: Two times a day (BID) | ORAL | 0 refills | Status: DC

## 2022-02-16 NOTE — Telephone Encounter (Signed)
New Rx for clonazepam 0.5 mg BID x 30 days sent today. Previous Rx canceled. She was instructed to take 2 tablets daily.

## 2022-02-16 NOTE — Telephone Encounter (Signed)
Pt returned call

## 2022-02-27 ENCOUNTER — Ambulatory Visit: Payer: Medicaid Other | Admitting: Internal Medicine

## 2022-02-27 ENCOUNTER — Encounter: Payer: Self-pay | Admitting: Internal Medicine

## 2022-03-27 ENCOUNTER — Encounter: Payer: Self-pay | Admitting: Internal Medicine

## 2022-03-30 ENCOUNTER — Ambulatory Visit (INDEPENDENT_AMBULATORY_CARE_PROVIDER_SITE_OTHER): Payer: Medicaid Other | Admitting: Internal Medicine

## 2022-03-30 ENCOUNTER — Encounter: Payer: Self-pay | Admitting: Internal Medicine

## 2022-03-30 DIAGNOSIS — B182 Chronic viral hepatitis C: Secondary | ICD-10-CM | POA: Insufficient documentation

## 2022-03-30 DIAGNOSIS — F419 Anxiety disorder, unspecified: Secondary | ICD-10-CM | POA: Diagnosis not present

## 2022-03-30 DIAGNOSIS — F32A Depression, unspecified: Secondary | ICD-10-CM | POA: Diagnosis not present

## 2022-03-30 MED ORDER — TRAZODONE HCL 50 MG PO TABS: 50.0000 mg | ORAL_TABLET | Freq: Every evening | ORAL | 2 refills | Status: AC | PRN

## 2022-03-30 MED ORDER — ESCITALOPRAM OXALATE 10 MG PO TABS: 10.0000 mg | ORAL_TABLET | Freq: Every day | ORAL | 2 refills | Status: DC
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
## 2022-06-23 ENCOUNTER — Encounter: Payer: Self-pay | Admitting: Internal Medicine

## 2022-06-23 ENCOUNTER — Ambulatory Visit: Payer: Medicaid Other | Admitting: Internal Medicine

## 2022-06-23 VITALS — BP 123/82 | HR 67 | Ht 65.0 in | Wt 124.8 lb

## 2022-06-23 DIAGNOSIS — Z0001 Encounter for general adult medical examination with abnormal findings: Secondary | ICD-10-CM | POA: Diagnosis not present

## 2022-06-23 DIAGNOSIS — Z131 Encounter for screening for diabetes mellitus: Secondary | ICD-10-CM | POA: Diagnosis not present

## 2022-06-23 DIAGNOSIS — Z1321 Encounter for screening for nutritional disorder: Secondary | ICD-10-CM

## 2022-06-23 DIAGNOSIS — Z13220 Encounter for screening for lipoid disorders: Secondary | ICD-10-CM

## 2022-06-23 DIAGNOSIS — Z720 Tobacco use: Secondary | ICD-10-CM | POA: Insufficient documentation

## 2022-06-23 DIAGNOSIS — F419 Anxiety disorder, unspecified: Secondary | ICD-10-CM | POA: Diagnosis not present

## 2022-06-23 DIAGNOSIS — Z1329 Encounter for screening for other suspected endocrine disorder: Secondary | ICD-10-CM

## 2022-06-23 DIAGNOSIS — F32A Depression, unspecified: Secondary | ICD-10-CM

## 2022-06-23 DIAGNOSIS — Z114 Encounter for screening for human immunodeficiency virus [HIV]: Secondary | ICD-10-CM

## 2022-06-23 DIAGNOSIS — Z789 Other specified health status: Secondary | ICD-10-CM

## 2022-06-23 DIAGNOSIS — Z124 Encounter for screening for malignant neoplasm of cervix: Secondary | ICD-10-CM | POA: Insufficient documentation

## 2022-06-23 MED ORDER — ESCITALOPRAM OXALATE 10 MG PO TABS: 10.0000 mg | ORAL_TABLET | Freq: Every day | ORAL | 1 refills | Status: AC

## 2022-06-23 NOTE — Patient Instructions (Signed)
It was a pleasure to see you today.  Thank you for giving [place] the opportunity to be involved in your care.  Below is a brief recap of your visit and next steps.  We will plan to see you again in 6 months.  Summary No medication changes today Labs have been ordered Plan for follow up in 6 months

## 2022-06-23 NOTE — Assessment & Plan Note (Signed)
Currently smoking 1 pack/day of cigarettes.  She is aware of the need to quit but is not ready to select a quit date.  She is concerned that quitting smoking at this point in time would significantly increase her stress. -The patient was counseled on the dangers of tobacco use, and was advised to quit.  Reviewed strategies to maximize success, including removing cigarettes and smoking materials from environment, stress management, substitution of other forms of reinforcement, support of family/friends, and written materials.

## 2022-06-23 NOTE — Progress Notes (Signed)
Established Patient Office Visit  Subjective   Patient ID: Ashley Huffman, female    DOB: Sep 06, 1990  Age: 32 y.o. MRN: [id number]  Chief Complaint  Patient presents with   Depression    Follow up   Anxiety    Follow up   Ms. Ashley Huffman returns to care today for follow up of anxiety and depression.  She was last evaluated by me through acute telephone encounter on 03/30/22 for medication refills.  There have been no acute interval events.  Ms. Ashley Huffman reports feeling well today.  She is asymptomatic and has no acute concerns to discuss.  Since her last appointment she has started a new job.  She has moved into her own home with her children.  She is currently going through the legal process for divorce and attempting to gain custody of her children.   Past Medical History:  Diagnosis Date   Anxiety    Asthma    Raynaud disease    Past Surgical History:  Procedure Laterality Date   WISDOM TOOTH EXTRACTION     Social History   Tobacco Use   Smoking status: Every Day    Packs/day: 1    Types: Cigarettes   Smokeless tobacco: Never  Substance Use Topics   Alcohol use: Never   Drug use: Yes    Comment: cbd pens   History reviewed. No pertinent family history. Allergies  Allergen Reactions   Keflex [Cephalexin] Other (See Comments)    Colitis   Review of Systems  Constitutional:  Negative for chills and fever.  HENT:  Negative for sore throat.   Respiratory:  Negative for cough and shortness of breath.   Cardiovascular:  Negative for chest pain, palpitations and leg swelling.  Gastrointestinal:  Negative for abdominal pain, blood in stool, constipation, diarrhea, nausea and vomiting.  Genitourinary:  Negative for dysuria and hematuria.  Musculoskeletal:  Negative for myalgias.  Skin:  Negative for itching and rash.  Neurological:  Negative for dizziness and headaches.  Psychiatric/Behavioral:  Negative for depression and suicidal ideas.       Objective:     BP 123/82    Pulse 67   Ht 5\' 5"  (1.651 m)   Wt 124 lb 12.8 oz (56.6 kg)   SpO2 98%   BMI 20.77 kg/m  BP Readings from Last 3 Encounters:  06/23/22 123/82  02/13/22 116/83  11/02/19 111/71      Physical Exam Vitals reviewed.  Constitutional:      General: She is not in acute distress.    Appearance: Normal appearance. She is not toxic-appearing.  HENT:     Head: Normocephalic and atraumatic.     Right Ear: External ear normal.     Left Ear: External ear normal.     Nose: Nose normal. No congestion or rhinorrhea.     Mouth/Throat:     Mouth: Mucous membranes are moist.     Pharynx: Oropharynx is clear. No oropharyngeal exudate or posterior oropharyngeal erythema.  Eyes:     General: No scleral icterus.    Extraocular Movements: Extraocular movements intact.     Conjunctiva/sclera: Conjunctivae normal.     Pupils: Pupils are equal, round, and reactive to light.  Cardiovascular:     Rate and Rhythm: Normal rate and regular rhythm.     Pulses: Normal pulses.     Heart sounds: Normal heart sounds. No murmur heard.    No friction rub. No gallop.  Pulmonary:     Effort: Pulmonary effort  is normal.     Breath sounds: Normal breath sounds. No wheezing, rhonchi or rales.  Abdominal:     General: Abdomen is flat. Bowel sounds are normal. There is no distension.     Palpations: Abdomen is soft.     Tenderness: There is no abdominal tenderness.  Musculoskeletal:        General: No swelling. Normal range of motion.     Cervical back: Normal range of motion.     Right lower leg: No edema.     Left lower leg: No edema.  Lymphadenopathy:     Cervical: No cervical adenopathy.  Skin:    General: Skin is warm and dry.     Capillary Refill: Capillary refill takes less than 2 seconds.     Coloration: Skin is not jaundiced.  Neurological:     General: No focal deficit present.     Mental Status: She is alert and oriented to person, place, and time.  Psychiatric:        Mood and Affect: Mood  normal.        Behavior: Behavior normal.      Assessment & Plan:   Problem List Items Addressed This Visit       Anxiety and depression    Symptoms are currently well-controlled on Lexapro 10 mg daily and trazodone as needed nightly.  She reports that she has been cutting her trazodone tablets in half because she does not need 50 mg.  She does not wish to make any medication changes today. -Lexapro has been refilled      Current tobacco use    Currently smoking 1 pack/day of cigarettes.  She is aware of the need to quit but is not ready to select a quit date.  She is concerned that quitting smoking at this point in time would significantly increase her stress. -The patient was counseled on the dangers of tobacco use, and was advised to quit.  Reviewed strategies to maximize success, including removing cigarettes and smoking materials from environment, stress management, substitution of other forms of reinforcement, support of family/friends, and written materials.       Cervical cancer screening    She is due for Pap smear and plans to establish care with OB/GYN closer to her home.      Return in about 6 months (around 12/24/2022).   Johnette Abraham, MD

## 2022-06-23 NOTE — Assessment & Plan Note (Signed)
She is due for Pap smear and plans to establish care with OB/GYN closer to her home.

## 2022-06-23 NOTE — Assessment & Plan Note (Signed)
Symptoms are currently well-controlled on Lexapro 10 mg daily and trazodone as needed nightly.  She reports that she has been cutting her trazodone tablets in half because she does not need 50 mg.  She does not wish to make any medication changes today. -Lexapro has been refilled

## 2022-06-24 LAB — LIPID PANEL
Chol/HDL Ratio: 1.9 ratio (ref 0.0–4.4)
Cholesterol, Total: 177 mg/dL (ref 100–199)
HDL: 95 mg/dL (ref >39)
LDL Chol Calc (NIH): 72 mg/dL (ref 0–99)
Triglycerides: 47 mg/dL (ref 0–149)
VLDL Cholesterol Cal: 10 mg/dL (ref 5–40)

## 2022-06-24 LAB — HEMOGLOBIN A1C
Est. average glucose Bld gHb Est-mCnc: 103 mg/dL
Hgb A1c MFr Bld: 5.2 % (ref 4.8–5.6)

## 2022-06-24 LAB — TSH+FREE T4
Free T4: 1.19 ng/dL (ref 0.82–1.77)
TSH: 1.7 u[IU]/mL (ref 0.450–4.500)

## 2022-06-24 LAB — VITAMIN D 25 HYDROXY (VIT D DEFICIENCY, FRACTURES): Vit D, 25-Hydroxy: 21 ng/mL — ABNORMAL LOW (ref 30.0–100.0)

## 2022-06-24 LAB — CBC WITH DIFFERENTIAL/PLATELET
Basophils Absolute: 0.1 10*3/uL (ref 0.0–0.2)
Basos: 1 %
EOS (ABSOLUTE): 0.1 10*3/uL (ref 0.0–0.4)
Eos: 2 %
Hematocrit: 41.6 % (ref 34.0–46.6)
Hemoglobin: 14.4 g/dL (ref 11.1–15.9)
Immature Grans (Abs): 0 10*3/uL (ref 0.0–0.1)
Immature Granulocytes: 0 %
Lymphocytes Absolute: 3.2 10*3/uL — ABNORMAL HIGH (ref 0.7–3.1)
Lymphs: 37 %
MCH: 32 pg (ref 26.6–33.0)
MCHC: 34.6 g/dL (ref 31.5–35.7)
MCV: 92 fL (ref 79–97)
Monocytes Absolute: 0.4 10*3/uL (ref 0.1–0.9)
Monocytes: 5 %
Neutrophils Absolute: 4.9 10*3/uL (ref 1.4–7.0)
Neutrophils: 55 %
Platelets: 291 10*3/uL (ref 150–450)
RBC: 4.5 x10E6/uL (ref 3.77–5.28)
RDW: 12.3 % (ref 11.7–15.4)
WBC: 8.7 10*3/uL (ref 3.4–10.8)

## 2022-06-24 LAB — HIV ANTIBODY (ROUTINE TESTING W REFLEX): HIV Screen 4th Generation wRfx: NONREACTIVE

## 2022-06-24 LAB — IRON,TIBC AND FERRITIN PANEL
Ferritin: 55 ng/mL (ref 15–150)
Iron Saturation: 45 % (ref 15–55)
Iron: 199 ug/dL — ABNORMAL HIGH (ref 27–159)
Total Iron Binding Capacity: 447 ug/dL (ref 250–450)
UIBC: 248 ug/dL (ref 131–425)

## 2022-06-24 LAB — CMP14+EGFR
ALT: 29 IU/L (ref 0–32)
AST: 30 IU/L (ref 0–40)
Albumin/Globulin Ratio: 2.5 — ABNORMAL HIGH (ref 1.2–2.2)
Albumin: 5.2 g/dL — ABNORMAL HIGH (ref 4.0–5.0)
Alkaline Phosphatase: 70 IU/L (ref 44–121)
BUN/Creatinine Ratio: 9 (ref 9–23)
BUN: 7 mg/dL (ref 6–20)
Bilirubin Total: 0.8 mg/dL (ref 0.0–1.2)
CO2: 23 mmol/L (ref 20–29)
Calcium: 10.2 mg/dL (ref 8.7–10.2)
Chloride: 99 mmol/L (ref 96–106)
Creatinine, Ser: 0.79 mg/dL (ref 0.57–1.00)
Globulin, Total: 2.1 g/dL (ref 1.5–4.5)
Glucose: 80 mg/dL (ref 70–99)
Potassium: 4.3 mmol/L (ref 3.5–5.2)
Sodium: 139 mmol/L (ref 134–144)
Total Protein: 7.3 g/dL (ref 6.0–8.5)
eGFR: 103 mL/min/{1.73_m2} (ref >59)

## 2022-06-24 LAB — B12 AND FOLATE PANEL
Folate: 12.8 ng/mL (ref >3.0)
Vitamin B-12: 461 pg/mL (ref 232–1245)

## 2022-06-28 ENCOUNTER — Telehealth: Payer: Self-pay | Admitting: Internal Medicine

## 2022-06-28 DIAGNOSIS — Z720 Tobacco use: Secondary | ICD-10-CM

## 2022-06-28 MED ORDER — VARENICLINE TARTRATE (STARTER) 0.5 MG X 11 & 1 MG X 42 PO TBPK: ORAL_TABLET | ORAL | 0 refills | Status: AC

## 2022-06-28 NOTE — Telephone Encounter (Signed)
Pt called stating she is wanting to start taking chantix    Ashley Huffman

## 2022-11-08 ENCOUNTER — Telehealth: Payer: Self-pay | Admitting: Internal Medicine

## 2022-11-08 NOTE — Telephone Encounter (Signed)
Pt calling says she has to have a court ordered hair follicle test she is requesting an order to be sent to [facility] on [address] in [place] just in case they are unable to do it with just her court papers. Has to have it done by today. Please advise Thank you

## 2022-11-08 NOTE — Telephone Encounter (Signed)
LVM

## 2022-12-29 ENCOUNTER — Telehealth: Payer: Medicaid Other | Admitting: Internal Medicine
